# Patient Record
Sex: Female | Born: 1955 | Race: White | Hispanic: No
Health system: Southern US, Community
[De-identification: ages and names within clinical notes are randomized; demographics above are authoritative.]

---

## 2014-12-25 ENCOUNTER — Encounter (HOSPITAL_COMMUNITY): Payer: Self-pay | Admitting: Emergency Medicine

## 2014-12-25 ENCOUNTER — Emergency Department (HOSPITAL_COMMUNITY)
Admission: EM | Admit: 2014-12-25 | Discharge: 2014-12-25 | Disposition: A | Discharge status: Home / Self Care | Payer: Worker's Compensation | Attending: Emergency Medicine | Admitting: Emergency Medicine

## 2014-12-25 ENCOUNTER — Emergency Department (HOSPITAL_COMMUNITY): Payer: Worker's Compensation

## 2014-12-25 DIAGNOSIS — Y998 Other external cause status: Secondary | ICD-10-CM | POA: Diagnosis not present

## 2014-12-25 DIAGNOSIS — W231XXA Caught, crushed, jammed, or pinched between stationary objects, initial encounter: Secondary | ICD-10-CM | POA: Diagnosis not present

## 2014-12-25 DIAGNOSIS — Y9389 Activity, other specified: Secondary | ICD-10-CM | POA: Diagnosis not present

## 2014-12-25 DIAGNOSIS — T148XXA Other injury of unspecified body region, initial encounter: Secondary | ICD-10-CM

## 2014-12-25 DIAGNOSIS — S6981XA Other specified injuries of right wrist, hand and finger(s), initial encounter: Secondary | ICD-10-CM

## 2014-12-25 DIAGNOSIS — S61011A Laceration without foreign body of right thumb without damage to nail, initial encounter: Secondary | ICD-10-CM | POA: Diagnosis present

## 2014-12-25 DIAGNOSIS — Y9289 Other specified places as the place of occurrence of the external cause: Secondary | ICD-10-CM | POA: Insufficient documentation

## 2014-12-25 DIAGNOSIS — S61101A Unspecified open wound of right thumb with damage to nail, initial encounter: Secondary | ICD-10-CM | POA: Insufficient documentation

## 2014-12-25 MED ORDER — TETANUS-DIPHTH-ACELL PERTUSSIS 5-2.5-18.5 LF-MCG/0.5 IM SUSP
0.5000 mL | Freq: Once | INTRAMUSCULAR | Status: AC
  Administered 2014-12-25: 0.5 mL via INTRAMUSCULAR
  Filled 2014-12-25: qty 0.5

## 2014-12-25 MED ORDER — LIDOCAINE HCL 2 % IJ SOLN
15.0000 mL | Freq: Once | INTRAMUSCULAR | Status: AC
  Administered 2014-12-25: 1 mg
  Filled 2014-12-25: qty 20

## 2014-12-25 NOTE — ED Provider Notes (Signed)
CSN: [id number]     Arrival date & time 12/25/14  3244 History  This chart was scribed for non-physician practitioner, Teressa Lower, NP, working with Jerelyn Scott, MD, by Ronney Lion, ED Scribe. This patient was seen in room TR07C/TR07C and the patient's care was started at 9:05 AM.    Chief Complaint  Patient presents with  . Finger Injury    right thumb   The history is provided by the patient. No language interpreter was used.    HPI Comments: Tasha Wells is a 59 y.o. female who presents to the Emergency Department complaining of constant, moderate right thumb pain following an injury that occurred PTA. Patient caught her thumb between two tables while moving a table of books and tried to pull her hand out, lacerating the tip of her thumb. She states she can't remember the date of her last tetanus vaccine. Patient denies ever seeing a hand specialist.   No past medical history on file. No past surgical history on file. No family history on file. History  Substance Use Topics  . Smoking status: Not on file  . Smokeless tobacco: Not on file  . Alcohol Use: Not on file   OB History    No data available     Review of Systems  All other systems reviewed and are negative.   Allergies  Review of patient's allergies indicates not on file.  Home Medications   Prior to Admission medications   Not on File   There were no vitals taken for this visit. Physical Exam  Constitutional: She is oriented to person, place, and time. She appears well-developed and well-nourished. No distress.  HENT:  Head: Normocephalic and atraumatic.  Eyes: Conjunctivae and EOM are normal.  Neck: Neck supple. No tracheal deviation present.  Cardiovascular: Normal rate.   Pulmonary/Chest: Effort normal. No respiratory distress.  Musculoskeletal: Normal range of motion.  Neurological: She is alert and oriented to person, place, and time.  Skin:  Avulsion of soft tissue of the right thumb. Nail  partially removed  Psychiatric: She has a normal mood and affect. Her behavior is normal.  Nursing note and vitals reviewed.   ED Course  Procedures (including critical care time)  DIAGNOSTIC STUDIES: Oxygen Saturation is 100% on RA, normal by my interpretation.    COORDINATION OF CARE: 9:05 AM - Discussed treatment plan with pt at bedside which includes right XR, and pt agreed to plan.  LACERATION REPAIR PROCEDURE NOTE The patient's identification was confirmed and consent was obtained. This procedure was performed by Teressa Lower, NP, working with Jerelyn Scott, MD at 10:25 AM. Site: right thumb Sterile procedures observed Anesthetic used (type and amt): 2% Lidocaine  Xeroform gauze applied Antibx ointment applied Tetanus ordered Site anesthetized, irrigated with NS, explored without evidence of foreign body, wound well approximated, site covered with dry, sterile dressing.  Patient tolerated procedure well without complications. Instructions for care discussed verbally and patient provided with additional written instructions for homecare and f/u.    Imaging Review Dg Finger Thumb Right  12/25/2014   CLINICAL DATA:  Injury to the right thumb.  EXAM: RIGHT THUMB 2+V  COMPARISON:  None.  FINDINGS: Evidence for soft tissue defect/injury along the tip of the thumb with a surrounding bandage. Small amount of lucency within the soft tissues at the tip of the thumb. Negative for a fracture or dislocation.  IMPRESSION: No acute bone abnormality to the right thumb.  Soft tissue injury to the thumb.  Electronically Signed   By: Richarda Overlie M.D.   On: 12/25/2014 09:40    MDM   Final diagnoses:  Skin avulsion  Nailbed injury, right, initial encounter   Nail placed back in place. Wound dressed and protected. Given hand follow up. No bony exposure or involvement  I personally performed the services described in this documentation, which was scribed in my presence. The recorded  information has been reviewed and is accurate.     Teressa Lower, NP 12/25/14 1227  Jerelyn Scott, MD 12/25/14 1315

## 2014-12-25 NOTE — Discharge Instructions (Signed)
Call to make a follow up appointment Fingertip Injuries and Amputations Fingertip injuries are common and often get injured because they are last to escape when pulling your hand out of harm's way. You have amputated (cut off) part of your finger. How this turns out depends largely on how much was amputated. If just the tip is amputated, often the end of the finger will grow back and the finger may return to much the same as it was before the injury.  If more of the finger is missing, your caregiver has done the best with the tissue remaining to allow you to keep as much finger as is possible. Your caregiver after checking your injury has tried to leave you with a painless fingertip that has durable, feeling skin. If possible, your caregiver has tried to maintain the finger's length and appearance and preserve its fingernail.  Please read the instructions outlined below and refer to this sheet in the next few weeks. These instructions provide you with general information on caring for yourself. Your caregiver may also give you specific instructions. While your treatment has been done according to the most current medical practices available, unavoidable complications occasionally occur. If you have any problems or questions after discharge, please call your caregiver. HOME CARE INSTRUCTIONS   You may resume normal diet and activities as directed or allowed.  Keep your hand elevated above the level of your heart. This helps decrease pain and swelling.  Keep ice packs (or a bag of ice wrapped in a towel) on the injured area for 15-20 minutes, 03-04 times per day, for the first two days.  Change dressings if necessary or as directed.  Clean the wound daily or as directed.  Only take over-the-counter or prescription medicines for pain, discomfort, or fever as directed by your caregiver.  Keep appointments as directed. SEEK IMMEDIATE MEDICAL CARE IF:  You develop redness, swelling, numbness or  increasing pain in the wound.  There is pus coming from the wound.  You develop an unexplained oral temperature above 102 F (38.9 C) or as your caregiver suggests.  There is a foul (bad) smell coming from the wound or dressing.  There is a breaking open of the wound (edges not staying together) after sutures or staples have been removed. MAKE SURE YOU:   Understand these instructions.  Will watch your condition.  Will get help right away if you are not doing well or get worse. Document Released: 05/21/2005 Document Revised: 09/22/2011 Document Reviewed: 04/19/2008 [facility] Patient Information 2015 [place], [place]. This information is not intended to replace advice given to you by your health care provider. Make sure you discuss any questions you have with your health care provider.

## 2014-12-25 NOTE — ED Notes (Signed)
Patient c/o of right thumb pain and laceration after moving a table of books and crushed between two tables.  Patient thinks she is missing the tip of the thumb.

## 2016-11-29 IMAGING — CR DG FINGER THUMB 2+V*R*
3 series · 3 of 3 positions shown · non-contrast
Comparison: None.

CLINICAL DATA: Injury to the right thumb.

EXAM:
RIGHT THUMB 2+V

[finger ap]
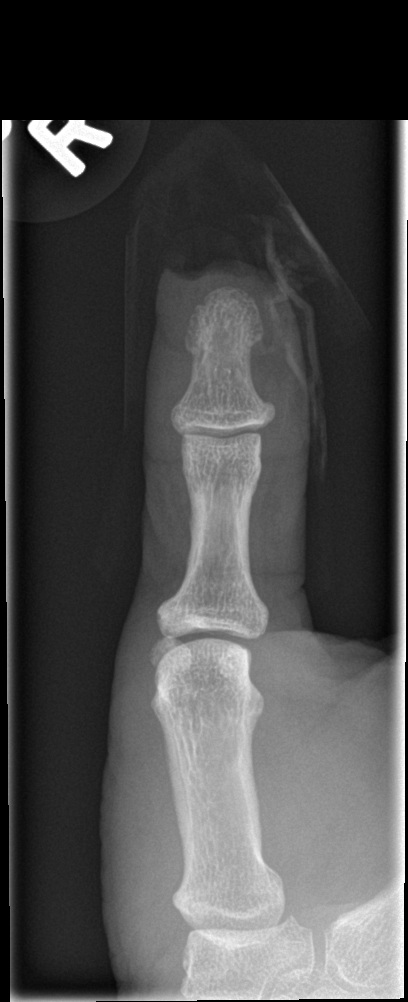

[finger obl]
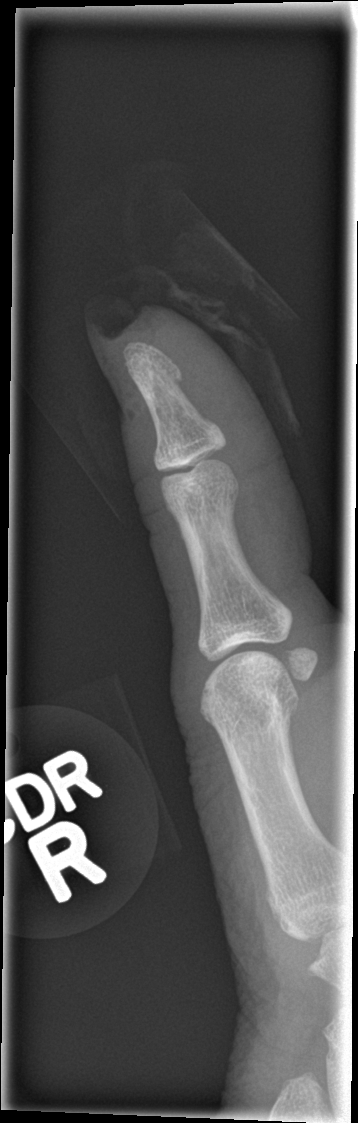

[finger lat]
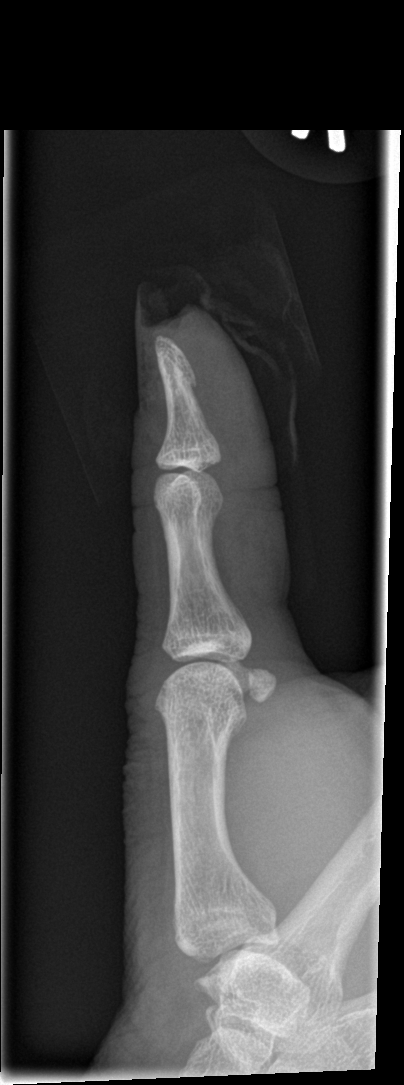

[3 of 3 positions shown; findings below may reference images not displayed]

FINDINGS: Evidence for soft tissue defect/injury along the tip of the thumb
with a surrounding bandage. Small amount of lucency within the soft
tissues at the tip of the thumb. Negative for a fracture or
dislocation.
IMPRESSION: No acute bone abnormality to the right thumb.

Soft tissue injury to the thumb.
